# Patient Record
Sex: Female | Born: 1965
Health system: Southern US, Community
[De-identification: ages and names within clinical notes are randomized; demographics above are authoritative.]

## PROBLEM LIST (undated history)

## (undated) HISTORY — PX: TUBAL LIGATION: SHX77

## (undated) HISTORY — PX: COLONOSCOPY: SHX174

---

## 2000-08-15 ENCOUNTER — Inpatient Hospital Stay (HOSPITAL_COMMUNITY): Admission: AD | Admit: 2000-08-15 | Discharge: 2000-08-15 | Payer: Self-pay | Admitting: Obstetrics & Gynecology

## 2000-08-24 ENCOUNTER — Inpatient Hospital Stay (HOSPITAL_COMMUNITY): Admission: AD | Admit: 2000-08-24 | Discharge: 2000-08-24 | Payer: Self-pay | Admitting: Obstetrics

## 2000-09-30 ENCOUNTER — Ambulatory Visit (HOSPITAL_COMMUNITY): Admission: RE | Admit: 2000-09-30 | Discharge: 2000-09-30 | Payer: Self-pay | Admitting: *Deleted

## 2001-01-25 ENCOUNTER — Inpatient Hospital Stay (HOSPITAL_COMMUNITY): Admission: AD | Admit: 2001-01-25 | Discharge: 2001-01-25 | Payer: Self-pay | Admitting: *Deleted

## 2001-02-27 ENCOUNTER — Inpatient Hospital Stay (HOSPITAL_COMMUNITY): Admission: AD | Admit: 2001-02-27 | Discharge: 2001-03-02 | Payer: Self-pay | Admitting: *Deleted

## 2001-06-27 ENCOUNTER — Inpatient Hospital Stay (HOSPITAL_COMMUNITY): Admission: AD | Admit: 2001-06-27 | Discharge: 2001-06-27 | Payer: Self-pay | Admitting: Obstetrics & Gynecology

## 2003-01-05 ENCOUNTER — Ambulatory Visit (HOSPITAL_COMMUNITY): Admission: RE | Admit: 2003-01-05 | Discharge: 2003-01-05 | Payer: Self-pay | Admitting: *Deleted

## 2003-04-26 ENCOUNTER — Inpatient Hospital Stay (HOSPITAL_COMMUNITY): Admission: AD | Admit: 2003-04-26 | Discharge: 2003-04-28 | Payer: Self-pay | Admitting: *Deleted

## 2003-12-10 ENCOUNTER — Ambulatory Visit (HOSPITAL_COMMUNITY): Admission: RE | Admit: 2003-12-10 | Discharge: 2003-12-10 | Payer: Self-pay | Admitting: Obstetrics & Gynecology

## 2004-12-27 ENCOUNTER — Ambulatory Visit (HOSPITAL_COMMUNITY): Admission: RE | Admit: 2004-12-27 | Discharge: 2004-12-27 | Payer: Self-pay | Admitting: Obstetrics and Gynecology

## 2007-03-24 ENCOUNTER — Ambulatory Visit: Payer: Self-pay | Admitting: Gastroenterology

## 2007-03-25 ENCOUNTER — Ambulatory Visit (HOSPITAL_COMMUNITY): Admission: RE | Admit: 2007-03-25 | Discharge: 2007-03-25 | Payer: Self-pay | Admitting: Obstetrics & Gynecology

## 2007-03-28 ENCOUNTER — Encounter: Payer: Self-pay | Admitting: Gastroenterology

## 2007-03-28 ENCOUNTER — Ambulatory Visit: Payer: Self-pay | Admitting: Gastroenterology

## 2007-08-22 DIAGNOSIS — D126 Benign neoplasm of colon, unspecified: Secondary | ICD-10-CM

## 2007-08-22 DIAGNOSIS — R109 Unspecified abdominal pain: Secondary | ICD-10-CM

## 2007-08-22 DIAGNOSIS — K5900 Constipation, unspecified: Secondary | ICD-10-CM | POA: Insufficient documentation

## 2010-10-24 NOTE — Assessment & Plan Note (Signed)
[facility]                         GASTROENTEROLOGY OFFICE NOTE   NAME:Marie Bowers, Marie Bowers                             MRN:          [id number]  DATE:03/24/2007                            DOB:          10/01/1965    REFERRING PHYSICIAN:  Peyton Najjar, MD   REASON FOR CONSULTATION:  Lower abdominal pain and constipation.   HISTORY OF PRESENT ILLNESS:  Marie Bowers is a 45 year old [place] female  who does not speak [place]. She is here with relatives who translate for  her. She relates a history of lower abdominal pain and constipation that  was causing substantial difficulties approximately one month ago.  Referral notes from [facility] relate complaints of  left lower quadrant pain. Today, she relates problems with right lower  quadrant pain as well. She noted very hard, small stools associated with  intestinal gas. Recently, her stools have been more normal and she has  not had any pain or intestinal gas. There has been no weight loss,  melena or hematochezia. CBC from October 06, 2006 showed a hemoglobin of  13.9 with an MCV of 77.9, white blood cell count was normal at 6.8.  Additional blood work from Dr. Annamaria Helling office from March 21, 2007 showed a normal CBC with a hemoglobin of 15 and a normal MCV of  83.5. Comprehensive metabolic panel and TSH were normal. There is no  family history of colon cancer, colon polyps or inflammatory bowel  disease.   PAST MEDICAL HISTORY:  Negative.   PAST SURGICAL HISTORY:  Negative.   CURRENT MEDICATIONS:  None.   MEDICATION ALLERGIES:  None known.   SOCIAL HISTORY:  She is married with five children. She moved to the  [place] from [place] in 2001. She denies tobacco and alcohol  product usage.   REVIEW OF SYSTEMS:  Is entirely negative.   PHYSICAL EXAMINATION:  Well-developed, thin, Asian female in no acute  distress. Height 5 feet, 3 inches. Weight is 104 pounds. Blood  pressure  112/68, pulse 60 and regular.  HEENT: Anicteric sclerae. Oropharynx clear.  CHEST: Clear to auscultation bilaterally.  CARDIAC: Regular rate and rhythm without murmurs appreciated.  ABDOMEN: Soft, minimal right lower quadrant tenderness to deep  palpation. No rebound or guarding.  Normoactive bowel sounds. No  palpable organomegaly, masses or hernias.  RECTAL: Deferred to time of colonoscopy.  EXTREMITIES: Without clubbing, cyanosis or edema.  NEUROLOGIC: Alert and oriented x3. Grossly nonfocal.   ASSESSMENT/PLAN:  Change in bowel habits with constipation, right lower  quadrant pain and left lower quadrant pain. She is advised to  substantially increase her dietary fiber and water intake. May use Symax  SL two sublingual or orally q.i.d. p.r.n. for recurrent abdominal or  pelvic pain. She may use a fiber supplement or a mild laxative, such as  milk of magnesia or Miralax, as needed. Rule out colorectal neoplasms.  Risks, benefits and alternatives to colonoscopy with possible biopsy and  possible polypectomy discussed with the patient, via translation, and  she consents to proceed. This will be  scheduled electively.     Venita Lick. Russella Dar, MD, [facility]  Electronically Signed    MTS/MedQ  DD: 03/24/2007  DT: 03/24/2007  Job #: [id number]   cc:   Peyton Najjar, MD

## 2010-10-27 NOTE — Op Note (Signed)
NAME:  Marie Bowers, Marie Bowers                               ACCOUNT NO.:  [id number]   MEDICAL RECORD NO.:  [id number]                   PATIENT TYPE:  AMB   LOCATION:  SDC                                  FACILITY:  [facility]   PHYSICIAN:  Roseanna Rainbow, M.D.         DATE OF BIRTH:  1966/03/19   DATE OF PROCEDURE:  12/10/2003  DATE OF DISCHARGE:                                 OPERATIVE REPORT   PREOPERATIVE DIAGNOSIS:  Multiparity, desires permanent sterilization.   POSTOPERATIVE DIAGNOSIS:  Multiparity, desires permanent sterilization.   PROCEDURE:  Laparoscopic tubal ligation with bipolar cautery.   SURGEON:  Roseanna Rainbow, M.D.   ANESTHESIA:  General endotracheal.   COMPLICATIONS:  None.   ESTIMATED BLOOD LOSS:  Less than 25 mL.   FLUIDS REPLACED:  As per anesthesiology.   URINE OUTPUT:  100 mL clear urine at the beginning of the procedure.   FINDINGS:  Normal uterus, tubes, and ovaries.   TECHNIQUE:  The patient was taken to the operating room, where general  anesthesia was obtained without difficulty.  The patient was then examined  under anesthesia and found to have a small anteverted uterus and normal  adnexa.  She was then placed in the dorsal lithotomy position and prepped  and draped in the usual sterile fashion.  A bivalve speculum was then placed  in the patient's vagina and the anterior lip of the cervix was grasped with  a single-tooth tenaculum.  A Hulka uterine manipulator was then advanced to  the uterus and secured to the anterior lip of the cervix as a means to  manipulate the uterus.  The speculum was removed from the vagina.  Attention  was then turned to the patient's abdomen, where a 10 mm skin incision was  made in the umbilical fold.  This incision was carried down to the  underlying fascia.  The fascia was entered sharply.  The parietal peritoneum  was entered sharply.  The peritoneal incision was felt to be free of any  adhesions.  A Hasson trocar  and sleeve was then advanced without difficulty  into the abdomen.  The abdomen was then insufflated with CO2 gas.  The  operative scope was then introduced into the sleeve and a survey of the  patient's pelvis and abdomen revealed entirely normal anatomy.  The mid-  isthmic portion of the patient's left fallopian tube was then cauterized  contiguously.  With each application the Ohm meter was noted to go to 0.  The right fallopian tube was manipulated in a similar fashion.  The  instruments were removed from the patient's abdomen.  The fascial incision  was repaired with 0 Vicryl.  The skin was  reapproximated with interrupted subcuticular stitches of 3-0 Vicryl and  Dermabond.  The Hulka manipulator was then removed from the vagina with no  bleeding noted from the cervix.  The patient tolerated the procedure well.  Sponge, lap, and needle counts were correct x2.  The patient was taken to  the PACU in stable condition.                                               Roseanna Rainbow, M.D.    Judee Clara  D:  12/10/2003  T:  12/11/2003  Job:  [id number]

## 2012-01-31 ENCOUNTER — Encounter: Payer: Self-pay | Admitting: Gastroenterology

## 2012-10-01 ENCOUNTER — Encounter: Payer: Self-pay | Admitting: Gastroenterology

## 2013-03-02 ENCOUNTER — Ambulatory Visit: Payer: Self-pay | Admitting: Obstetrics & Gynecology

## 2013-03-20 ENCOUNTER — Ambulatory Visit: Payer: Self-pay | Admitting: Advanced Practice Midwife

## 2013-04-02 ENCOUNTER — Ambulatory Visit (INDEPENDENT_AMBULATORY_CARE_PROVIDER_SITE_OTHER): Payer: BC Managed Care – PPO | Admitting: Obstetrics & Gynecology

## 2013-04-02 ENCOUNTER — Encounter: Payer: Self-pay | Admitting: Obstetrics & Gynecology

## 2013-04-02 VITALS — BP 137/85 | HR 64 | Temp 97.9°F | Ht 63.0 in | Wt 124.0 lb

## 2013-04-02 DIAGNOSIS — N926 Irregular menstruation, unspecified: Secondary | ICD-10-CM

## 2013-04-02 LAB — COMPREHENSIVE METABOLIC PANEL
ALT: 11 U/L (ref 0–35)
AST: 17 U/L (ref 0–37)
CO2: 27 mEq/L (ref 19–32)
Creat: 0.72 mg/dL (ref 0.50–1.10)
Sodium: 139 mEq/L (ref 135–145)
Total Bilirubin: 0.3 mg/dL (ref 0.3–1.2)
Total Protein: 7.6 g/dL (ref 6.0–8.3)

## 2013-04-02 LAB — CBC
MCH: 25.5 pg — ABNORMAL LOW (ref 26.0–34.0)
MCV: 78.8 fL (ref 78.0–100.0)
Platelets: 302 10*3/uL (ref 150–400)
RBC: 5.09 MIL/uL (ref 3.87–5.11)
RDW: 13.7 % (ref 11.5–15.5)
WBC: 7.8 10*3/uL (ref 4.0–10.5)

## 2013-04-02 LAB — CHOLESTEROL, TOTAL: Cholesterol: 213 mg/dL — ABNORMAL HIGH (ref 0–200)

## 2013-04-02 NOTE — Progress Notes (Signed)
Subjective:     Marie Bowers is a 47 y.o. female here for a routine exam.  Current complaints: Patient is in the office today for irregular cycles- patient states she started irregular bleeding around July. Patient states she can have bleeding last up to 2- 3 weeks .  Personal health questionnaire reviewed: no.   Gynecologic History Patient's last menstrual period was 02/28/2013. Contraception: tubal ligation Last Pap:2011 . Results were: normal Last mammogram: over 1 year. Results were: normal  Obstetric History OB History  No data available     The following portions of the patient's history were reviewed and updated as appropriate: allergies, current medications, past family history, past medical history, past social history, past surgical history and problem list.  Review of Systems Pertinent items are noted in HPI.    Objective:      General appearance: alert Breasts: normal appearance, no masses or tenderness Abdomen: soft, non-tender; bowel sounds normal; no masses,  no organomegaly Pelvic: cervix normal in appearance, external genitalia normal, no adnexal masses or tenderness, uterus normal size, shape, and consistency and vagina normal without discharge       Assessment:   AUB--likely O, perimenopausal  Plan:   Orders Placed This Encounter  Procedures  . CBC  . Comprehensive metabolic panel  . Prolactin  . HDL cholesterol  . Cholesterol, total  . TSH  Return for sonoHSG w/endometrial bx

## 2013-04-03 ENCOUNTER — Encounter: Payer: Self-pay | Admitting: Obstetrics & Gynecology

## 2013-04-03 LAB — PAP IG W/ RFLX HPV ASCU

## 2013-04-03 LAB — TSH: TSH: 1.032 u[IU]/mL (ref 0.350–4.500)

## 2013-04-03 LAB — PROLACTIN: Prolactin: 12.7 ng/mL

## 2013-04-03 NOTE — Patient Instructions (Signed)

## 2013-04-07 ENCOUNTER — Other Ambulatory Visit: Payer: Self-pay | Admitting: *Deleted

## 2013-04-07 DIAGNOSIS — N926 Irregular menstruation, unspecified: Secondary | ICD-10-CM

## 2013-04-14 ENCOUNTER — Other Ambulatory Visit: Payer: Self-pay | Admitting: *Deleted

## 2013-04-14 ENCOUNTER — Telehealth: Payer: Self-pay | Admitting: *Deleted

## 2013-04-15 ENCOUNTER — Other Ambulatory Visit: Payer: BC Managed Care – PPO

## 2013-04-15 ENCOUNTER — Ambulatory Visit: Payer: BC Managed Care – PPO | Admitting: Obstetrics & Gynecology

## 2013-04-17 NOTE — Telephone Encounter (Signed)
error 

## 2013-05-13 ENCOUNTER — Other Ambulatory Visit: Payer: Self-pay

## 2013-05-13 ENCOUNTER — Ambulatory Visit (INDEPENDENT_AMBULATORY_CARE_PROVIDER_SITE_OTHER): Payer: BC Managed Care – PPO | Admitting: Obstetrics & Gynecology

## 2013-05-13 ENCOUNTER — Ambulatory Visit (INDEPENDENT_AMBULATORY_CARE_PROVIDER_SITE_OTHER): Payer: BC Managed Care – PPO

## 2013-05-13 ENCOUNTER — Encounter: Payer: Self-pay | Admitting: Obstetrics & Gynecology

## 2013-05-13 DIAGNOSIS — N926 Irregular menstruation, unspecified: Secondary | ICD-10-CM

## 2013-05-13 DIAGNOSIS — N939 Abnormal uterine and vaginal bleeding, unspecified: Secondary | ICD-10-CM | POA: Insufficient documentation

## 2013-05-13 NOTE — Progress Notes (Signed)
SONOHYSTEROGRAM PROCEDURE NOTE  SONOHYSTEROGRAM PROCEDURE:   A time-out was performed confirming patient name, allergy status and procedure.  A UPT was negative.  A Graves speculum was placed in the vagina.  The cervix was prepped with betadine.  The cervix was grasped with a single tooth tenaculum.  The IUI catherter was primed with sterile water.  The IUI was introduced into the uterine cavity.  The single tooth tenaculum and speculum were removed.  The vaginal probe was placed.  The sterile water was instilled in the uterine cavity.   An endometrial biopsy was taken prior to installation to the sterile water.  Scant tissue was retrieved after 2 passes.  The IUI was removed.  The single tooth tenaculum was removed with minimal bleeding noted from the cervix.  The patient tolerated the procedure well.  FINDINGS:  Thin, homogeneous endometrial lining.  No intracavitary lesions.

## 2013-05-13 NOTE — Patient Instructions (Signed)

## 2013-05-27 ENCOUNTER — Ambulatory Visit: Payer: BC Managed Care – PPO | Admitting: Obstetrics & Gynecology

## 2013-06-08 ENCOUNTER — Ambulatory Visit: Payer: BC Managed Care – PPO | Admitting: Obstetrics & Gynecology

## 2013-06-08 ENCOUNTER — Telehealth: Payer: Self-pay | Admitting: *Deleted

## 2013-06-08 NOTE — Telephone Encounter (Signed)
Patients daughter called and left voice message stating patient had a appointment to go over lab results. Patients daughter states patient has had to re-schedule appointment 3 times and she would like to know what the lab results are. After review of labs, patients daughter was informed per Dr Tamela Oddi instructions on elevation of cholesterol and suggested referral to primary care for evaluation. Patients daughter declined referral for primary care and stated patient will continue with exercise and diet change. She state if referral is needed she will call back to request.  Copy of lab results mailed to patient per request.

## 2013-07-01 ENCOUNTER — Encounter: Payer: Self-pay | Admitting: Obstetrics & Gynecology

## 2014-06-07 ENCOUNTER — Encounter: Payer: Self-pay | Admitting: *Deleted

## 2015-02-21 ENCOUNTER — Ambulatory Visit (INDEPENDENT_AMBULATORY_CARE_PROVIDER_SITE_OTHER): Payer: BLUE CROSS/BLUE SHIELD | Admitting: Family Medicine

## 2015-02-21 ENCOUNTER — Ambulatory Visit: Payer: BLUE CROSS/BLUE SHIELD

## 2015-02-21 ENCOUNTER — Other Ambulatory Visit: Payer: Self-pay | Admitting: Emergency Medicine

## 2015-02-21 VITALS — BP 106/68 | HR 60 | Temp 97.8°F | Resp 17 | Wt 121.0 lb

## 2015-02-21 DIAGNOSIS — R509 Fever, unspecified: Secondary | ICD-10-CM

## 2015-02-21 DIAGNOSIS — N951 Menopausal and female climacteric states: Secondary | ICD-10-CM | POA: Diagnosis not present

## 2015-02-21 DIAGNOSIS — Z789 Other specified health status: Secondary | ICD-10-CM

## 2015-02-21 DIAGNOSIS — R232 Flushing: Secondary | ICD-10-CM

## 2015-02-21 LAB — COMPREHENSIVE METABOLIC PANEL
ALT: 20 U/L (ref 6–29)
AST: 20 U/L (ref 10–35)
Albumin: 3.7 g/dL (ref 3.6–5.1)
Alkaline Phosphatase: 53 U/L (ref 33–115)
BUN: 14 mg/dL (ref 7–25)
CHLORIDE: 107 mmol/L (ref 98–110)
CO2: 27 mmol/L (ref 20–31)
CREATININE: 0.67 mg/dL (ref 0.50–1.10)
Calcium: 8.9 mg/dL (ref 8.6–10.2)
Glucose, Bld: 92 mg/dL (ref 65–99)
POTASSIUM: 4.2 mmol/L (ref 3.5–5.3)
Sodium: 139 mmol/L (ref 135–146)
Total Bilirubin: 0.5 mg/dL (ref 0.2–1.2)
Total Protein: 6.8 g/dL (ref 6.1–8.1)

## 2015-02-21 LAB — POCT CBC
GRANULOCYTE PERCENT: 50.9 % (ref 37–80)
HEMATOCRIT: 42.1 % (ref 37.7–47.9)
HEMOGLOBIN: 13.2 g/dL (ref 12.2–16.2)
Lymph, poc: 2.3 (ref 0.6–3.4)
MCH: 24.7 pg — AB (ref 27–31.2)
MCHC: 31.5 g/dL — AB (ref 31.8–35.4)
MCV: 78.5 fL — AB (ref 80–97)
MID (cbc): 0.4 (ref 0–0.9)
MPV: 8.3 fL (ref 0–99.8)
POC GRANULOCYTE: 2.8 (ref 2–6.9)
POC LYMPH PERCENT: 41 %L (ref 10–50)
POC MID %: 8.1 % (ref 0–12)
Platelet Count, POC: 233 10*3/uL (ref 142–424)
RBC: 5.35 M/uL (ref 4.04–5.48)
RDW, POC: 14.8 %
WBC: 5.5 10*3/uL (ref 4.6–10.2)

## 2015-02-21 LAB — TSH: TSH: 0.489 u[IU]/mL (ref 0.350–4.500)

## 2015-02-21 LAB — POCT URINE PREGNANCY: PREG TEST UR: NEGATIVE

## 2015-02-21 NOTE — Patient Instructions (Signed)
Please come and see [place] for your chest x-ray when you can- this is an important test to do  I will be in touch with your labs

## 2015-02-21 NOTE — Progress Notes (Signed)
[facility] [address] [place] [address] [phone]  Date:  02/21/2015   Name:  Marie Bowers   DOB:  December 31, 1965   MRN:  [id number]  PCP:  No primary care provider on file.    Chief Complaint: Fever; Cough; and Acne   History of Present Illness:  Marie Bowers is a 49 y.o. very pleasant female patient who presents with the following:  Here today as a new patient.   History of BTL and she is also post- menopausal  She has been having fevers at night and in the morning for since June.    She has been having sweats- they have not actually measured a temperature.  She has an occasional daytime hot flash.   Pt dos not speak [place]- however a family member translates for her  She works once a week at a [employer]; this seems to maybe make her worse  Her LMP was in December of 2015. She has notes more acne over the last few months.  She has lost about 5 lbs by her recollection   Patient Active Problem List   Diagnosis Date Noted  . Abnormal uterine bleeding 05/13/2013  . COLONIC POLYPS, ADENOMATOUS 08/22/2007  . CONSTIPATION 08/22/2007  . ABDOMINAL PAIN, LOWER 08/22/2007    No past medical history on file.  Past Surgical History  Procedure Laterality Date  . Tubal ligation      Social History  Substance Use Topics  . Smoking status: Never Smoker   . Smokeless tobacco: None  . Alcohol Use: No    Family History  Problem Relation Age of Onset  . Adopted: Yes    No Known Allergies  Medication list has been reviewed and updated.  Current Outpatient Prescriptions on File Prior to Visit  Medication Sig Dispense Refill  . Multiple Vitamins-Minerals (MULTIVITAMIN WITH MINERALS) tablet Take 1 tablet by mouth daily.     No current facility-administered medications on file prior to visit.    Review of Systems:  As per HPI- otherwise negative.   Physical Examination: Filed Vitals:   02/21/15 1128  BP: 106/68  Pulse: 60  Temp: 97.8 F (36.6  C)  Resp: 17   Filed Vitals:   02/21/15 1128  Weight: 121 lb (54.885 kg)   Body mass index is 21.44 kg/(m^2). Ideal Body Weight:    GEN: WDWN, NAD, Non-toxic, A & O x 3 HEENT: Atraumatic, Normocephalic. Neck supple. No masses, No LAD.  Bilateral TM wnl, oropharynx normal.  PEERL,EOMI.   Ears and Nose: No external deformity. CV: RRR, No M/G/R. No JVD. No thrill. No extra heart sounds. PULM: CTA B, no wheezes, crackles, rhonchi. No retractions. No resp. distress. No accessory muscle use. ABD: S, NT, ND, No rebound. No HSM. EXTR: No c/c/e NEURO Normal gait.  PSYCH: Normally interactive. Conversant. Not depressed or anxious appearing.  Calm demeanor.   Results for orders placed or performed in visit on 02/21/15  POCT urine pregnancy  Result Value Ref Range   Preg Test, Ur Negative Negative    Assessment and Plan: Fever, unspecified fever cause - Plan: POCT CBC, Comprehensive metabolic panel, TSH, POCT urine pregnancy, HIV antibody, Acute Hep Panel & Hep B Surface Ab, CANCELED: DG Chest 2 View  Hot flashes - Plan: HIV antibody  Pt decided she could not wait for a chest x-ray Labs pending as above There is a cultural and language barrier which complicates this issue, but I suspect her fevers may actually  be hot flashes associated with menopause Encouraged them to check her temp and see if she is running a fever Instructed to come back for CXR asap  Signed Lamar Blinks, MD

## 2015-02-22 LAB — ACUTE HEP PANEL AND HEP B SURFACE AB
HCV AB: NEGATIVE
HEP B C IGM: NONREACTIVE
HEP B S AB: POSITIVE — AB
Hep A IgM: NONREACTIVE
Hepatitis B Surface Ag: NEGATIVE

## 2015-02-22 LAB — HIV ANTIBODY (ROUTINE TESTING W REFLEX): HIV 1&2 Ab, 4th Generation: NONREACTIVE

## 2015-02-23 ENCOUNTER — Encounter: Payer: Self-pay | Admitting: Family Medicine

## 2015-02-28 ENCOUNTER — Ambulatory Visit (INDEPENDENT_AMBULATORY_CARE_PROVIDER_SITE_OTHER): Payer: BLUE CROSS/BLUE SHIELD

## 2015-02-28 ENCOUNTER — Other Ambulatory Visit: Payer: Self-pay | Admitting: *Deleted

## 2015-02-28 ENCOUNTER — Ambulatory Visit (INDEPENDENT_AMBULATORY_CARE_PROVIDER_SITE_OTHER): Payer: BLUE CROSS/BLUE SHIELD | Admitting: Family Medicine

## 2015-02-28 VITALS — BP 116/76 | HR 65 | Temp 98.0°F | Resp 18 | Ht 63.0 in | Wt 120.0 lb

## 2015-02-28 DIAGNOSIS — N951 Menopausal and female climacteric states: Secondary | ICD-10-CM

## 2015-02-28 DIAGNOSIS — R232 Flushing: Secondary | ICD-10-CM

## 2015-02-28 MED ORDER — PAROXETINE HCL 10 MG PO TABS: 10.0000 mg | ORAL_TABLET | Freq: Every day | ORAL | Status: AC

## 2015-02-28 NOTE — Progress Notes (Signed)
[facility] [address], [place] [address] [phone]  Date:  02/28/2015   Name:  Marie Bowers   DOB:  1966-05-08   MRN:  [id number]  PCP:  No primary care provider on file.    Chief Complaint: Follow-up   History of Present Illness:  Marie Bowers is a 49 y.o. very pleasant female patient who presents with the following:  I saw this pt a week ago with complaint of "fevers," which we think may have actually been hot flashes.   At that time we planned labs and a CXR, but she left without getting her x-ray Other labs so far have looked ok She came in today to get herx-ray Has has sweats just a couple of times since her last visit Still has not checked her temperature so we do not know if she is having fevers  Patient Active Problem List   Diagnosis Date Noted  . Language barrier affecting health care 02/21/2015  . Abnormal uterine bleeding 05/13/2013  . COLONIC POLYPS, ADENOMATOUS 08/22/2007  . CONSTIPATION 08/22/2007  . ABDOMINAL PAIN, LOWER 08/22/2007    History reviewed. No pertinent past medical history.  Past Surgical History  Procedure Laterality Date  . Tubal ligation      Social History  Substance Use Topics  . Smoking status: Never Smoker   . Smokeless tobacco: None  . Alcohol Use: No    Family History  Problem Relation Age of Onset  . Adopted: Yes    No Known Allergies  Medication list has been reviewed and updated.  Current Outpatient Prescriptions on File Prior to Visit  Medication Sig Dispense Refill  . Multiple Vitamins-Minerals (MULTIVITAMIN WITH MINERALS) tablet Take 1 tablet by mouth daily.     No current facility-administered medications on file prior to visit.    Review of Systems:  As per HPI- otherwise negative.   Physical Examination: Filed Vitals:   02/28/15 1122  BP: 116/76  Pulse: 65  Temp: 98 F (36.7 C)  Resp: 18   Filed Vitals:   02/28/15 1122  Height: 5\' 3"  (1.6 m)  Weight: 120 lb (54.432 kg)    Body mass index is 21.26 kg/(m^2). Ideal Body Weight: Weight in (lb) to have BMI = 25: 140.8  GEN: WDWN, NAD, Non-toxic, A & O x 3, looks well, normal weight HEENT: Atraumatic, Normocephalic. Neck supple. No masses, No LAD. Ears and Nose: No external deformity. CV: RRR, No M/G/R. No JVD. No thrill. No extra heart sounds. PULM: CTA B, no wheezes, crackles, rhonchi. No retractions. No resp. distress. No accessory muscle use. EXTR: No c/c/e NEURO Normal gait.  PSYCH: Normally interactive. Conversant. Not depressed or anxious appearing.  Calm demeanor.   [facility] reading (PRIMARY) by  Dr. Lorelei Pont. CXR: no evidence of TB   COMPARISON: None.  FINDINGS: The heart size and mediastinal contours are within normal limits. Both lungs are clear. The visualized skeletal structures are unremarkable.  IMPRESSION: No active cardiopulmonary disease.    Assessment and Plan: Hot flashes - Plan: DG Chest 2 View, PARoxetine (PAXIL) 10 MG tablet  Suspect that her fevers are hot flashes.  However she has not been checking her temperature.  Asked her to please do this so we can find out if she is really running any fever.  Will start on low dose paxil at 10 mg a day for now for suspected vasomotor sx  Signed Lamar Blinks, MD

## 2015-02-28 NOTE — Patient Instructions (Signed)
I suspect that you are having hot flashes (these are common in women who are going through menopause) It is less likely that you are actually having fevers; however, please check your temperature with a thermometer when you have these symtpoms If you are not sure if your temperatures indicate a fever please let me know In the meantime we will start paxil 10 mg; take this once a day I hope that in a few weeks it will help with your hot flashes Let me know if anything is changing or getting worse   Please see [place] in 2-71months for a recheck

## 2019-11-30 ENCOUNTER — Encounter: Payer: Self-pay | Admitting: Obstetrics and Gynecology

## 2019-11-30 ENCOUNTER — Ambulatory Visit (INDEPENDENT_AMBULATORY_CARE_PROVIDER_SITE_OTHER): Payer: BC Managed Care – PPO | Admitting: Obstetrics and Gynecology

## 2019-11-30 ENCOUNTER — Other Ambulatory Visit (HOSPITAL_COMMUNITY)
Admission: RE | Admit: 2019-11-30 | Discharge: 2019-11-30 | Disposition: A | Discharge status: Home / Self Care | Payer: BC Managed Care – PPO | Source: Ambulatory Visit | Attending: Obstetrics and Gynecology | Admitting: Obstetrics and Gynecology

## 2019-11-30 VITALS — BP 124/81 | HR 60 | Ht 63.0 in | Wt 125.0 lb

## 2019-11-30 DIAGNOSIS — Z01419 Encounter for gynecological examination (general) (routine) without abnormal findings: Secondary | ICD-10-CM | POA: Diagnosis not present

## 2019-11-30 DIAGNOSIS — Z13220 Encounter for screening for lipoid disorders: Secondary | ICD-10-CM | POA: Diagnosis not present

## 2019-11-30 DIAGNOSIS — Z1329 Encounter for screening for other suspected endocrine disorder: Secondary | ICD-10-CM | POA: Diagnosis not present

## 2019-11-30 DIAGNOSIS — Z131 Encounter for screening for diabetes mellitus: Secondary | ICD-10-CM | POA: Diagnosis not present

## 2019-11-30 NOTE — Progress Notes (Signed)
Pt presents for annual and pap. STD testing offered; pt declined  Pt has no complaints Overdue Mammogram Colonoscopy UTD per pt  PHQ= 0

## 2019-11-30 NOTE — Progress Notes (Signed)
Subjective:     Marie Bowers is a 54 y.o. female and is here for a comprehensive physical exam. The patient reports no problems. She reports onset of menopause 4 years ago without any episodes of postmenopausal vaginal bleeding. She is sexually active without complaints. She denies pelvic pain or abnormal discharge. She denies any issues with urinary continence.   History reviewed. No pertinent past medical history. Past Surgical History:  Procedure Laterality Date   TUBAL LIGATION     Family History  Adopted: Yes    Social History   Socioeconomic History   Marital status: Married    Spouse name: Not on file   Number of children: Not on file   Years of education: Not on file   Highest education level: Not on file  Occupational History   Not on file  Tobacco Use   Smoking status: Never Smoker  Substance and Sexual Activity   Alcohol use: No   Drug use: No   Sexual activity: Yes    Partners: Male    Birth control/protection: Surgical  Other Topics Concern   Not on file  Social History Narrative   Not on file   Social Determinants of Health   Financial Resource Strain:    Difficulty of Paying Living Expenses:   Food Insecurity:    Worried About Charity fundraiser in the Last Year:    Arboriculturist in the Last Year:   Transportation Needs:    Film/video editor (Medical):    Lack of Transportation (Non-Medical):   Physical Activity:    Days of Exercise per Week:    Minutes of Exercise per Session:   Stress:    Feeling of Stress :   Social Connections:    Frequency of Communication with Friends and Family:    Frequency of Social Gatherings with Friends and Family:    Attends Religious Services:    Active Member of Clubs or Organizations:    Attends Music therapist:    Marital Status:   Intimate Partner Violence:    Fear of Current or Ex-Partner:    Emotionally Abused:    Physically Abused:    Sexually Abused:     Health Maintenance  Topic Date Due   COVID-19 Vaccine (1) Never done   TETANUS/TDAP  Never done   MAMMOGRAM  Never done   PAP SMEAR-Modifier  04/02/2016   COLONOSCOPY  03/27/2017   INFLUENZA VACCINE  01/10/2020   Hepatitis C Screening  Completed   HIV Screening  Completed       Review of Systems Pertinent items noted in HPI and remainder of comprehensive ROS otherwise negative.   Objective:  Blood pressure 124/81, pulse 60, height 5\' 3"  (1.6 m), weight 125 lb (56.7 kg), last menstrual period 02/28/2013.     GENERAL: Well-developed, well-nourished female in no acute distress.  HEENT: Normocephalic, atraumatic. Sclerae anicteric.  NECK: Supple. Normal thyroid.  LUNGS: Clear to auscultation bilaterally.  HEART: Regular rate and rhythm. BREASTS: Symmetric in size. No palpable masses or lymphadenopathy, skin changes, or nipple drainage. ABDOMEN: Soft, nontender, nondistended. No organomegaly. PELVIC: Normal external female genitalia. Vagina is pink and rugated.  Normal discharge. Normal appearing cervix. Uterus is normal in size. No adnexal mass or tenderness. EXTREMITIES: No cyanosis, clubbing, or edema, 2+ distal pulses.    Assessment:    Healthy female exam.      Plan:    Pap smear collected Screening mammogram ordered Fasting labs ordered  Patient  will be contacted with abnormal results See After Visit Summary for Counseling Recommendations

## 2019-12-01 LAB — LIPID PANEL
Chol/HDL Ratio: 3.9 ratio (ref 0.0–4.4)
Cholesterol, Total: 219 mg/dL — ABNORMAL HIGH (ref 100–199)
HDL: 56 mg/dL (ref >39)
LDL Chol Calc (NIH): 142 mg/dL — ABNORMAL HIGH (ref 0–99)
Triglycerides: 116 mg/dL (ref 0–149)
VLDL Cholesterol Cal: 21 mg/dL (ref 5–40)

## 2019-12-01 LAB — COMPREHENSIVE METABOLIC PANEL
ALT: 12 IU/L (ref 0–32)
AST: 19 IU/L (ref 0–40)
Albumin/Globulin Ratio: 1.5 (ref 1.2–2.2)
Albumin: 4.1 g/dL (ref 3.8–4.9)
Alkaline Phosphatase: 60 IU/L (ref 48–121)
BUN/Creatinine Ratio: 26 — ABNORMAL HIGH (ref 9–23)
BUN: 17 mg/dL (ref 6–24)
Bilirubin Total: 0.3 mg/dL (ref 0.0–1.2)
CO2: 22 mmol/L (ref 20–29)
Calcium: 9 mg/dL (ref 8.7–10.2)
Chloride: 109 mmol/L — ABNORMAL HIGH (ref 96–106)
Creatinine, Ser: 0.66 mg/dL (ref 0.57–1.00)
GFR calc Af Amer: 116 mL/min/{1.73_m2} (ref >59)
GFR calc non Af Amer: 100 mL/min/{1.73_m2} (ref >59)
Globulin, Total: 2.8 g/dL (ref 1.5–4.5)
Glucose: 100 mg/dL — ABNORMAL HIGH (ref 65–99)
Potassium: 4.5 mmol/L (ref 3.5–5.2)
Sodium: 142 mmol/L (ref 134–144)
Total Protein: 6.9 g/dL (ref 6.0–8.5)

## 2019-12-01 LAB — CBC
Hematocrit: 43.8 % (ref 34.0–46.6)
Hemoglobin: 13.7 g/dL (ref 11.1–15.9)
MCH: 25.7 pg — ABNORMAL LOW (ref 26.6–33.0)
MCHC: 31.3 g/dL — ABNORMAL LOW (ref 31.5–35.7)
MCV: 82 fL (ref 79–97)
Platelets: 248 10*3/uL (ref 150–450)
RBC: 5.33 x10E6/uL — ABNORMAL HIGH (ref 3.77–5.28)
RDW: 13.4 % (ref 11.7–15.4)
WBC: 6.1 10*3/uL (ref 3.4–10.8)

## 2019-12-01 LAB — TSH: TSH: 1.41 u[IU]/mL (ref 0.450–4.500)

## 2019-12-01 LAB — HEMOGLOBIN A1C
Est. average glucose Bld gHb Est-mCnc: 114 mg/dL
Hgb A1c MFr Bld: 5.6 % (ref 4.8–5.6)

## 2019-12-03 LAB — CYTOLOGY - PAP
Comment: NEGATIVE
Diagnosis: NEGATIVE
High risk HPV: NEGATIVE

## 2019-12-28 ENCOUNTER — Ambulatory Visit
Admission: RE | Admit: 2019-12-28 | Discharge: 2019-12-28 | Disposition: A | Discharge status: Home / Self Care | Payer: BC Managed Care – PPO | Source: Ambulatory Visit | Attending: Obstetrics and Gynecology | Admitting: Obstetrics and Gynecology

## 2019-12-28 ENCOUNTER — Other Ambulatory Visit: Payer: Self-pay

## 2019-12-28 DIAGNOSIS — Z1231 Encounter for screening mammogram for malignant neoplasm of breast: Secondary | ICD-10-CM | POA: Diagnosis not present

## 2019-12-28 DIAGNOSIS — Z01419 Encounter for gynecological examination (general) (routine) without abnormal findings: Secondary | ICD-10-CM

## 2019-12-30 ENCOUNTER — Other Ambulatory Visit: Payer: Self-pay | Admitting: Obstetrics and Gynecology

## 2019-12-30 DIAGNOSIS — R928 Other abnormal and inconclusive findings on diagnostic imaging of breast: Secondary | ICD-10-CM

## 2020-01-11 ENCOUNTER — Ambulatory Visit
Admission: RE | Admit: 2020-01-11 | Discharge: 2020-01-11 | Disposition: A | Discharge status: Home / Self Care | Payer: BC Managed Care – PPO | Source: Ambulatory Visit | Attending: Obstetrics and Gynecology | Admitting: Obstetrics and Gynecology

## 2020-01-11 ENCOUNTER — Other Ambulatory Visit: Payer: Self-pay

## 2020-01-11 DIAGNOSIS — N6489 Other specified disorders of breast: Secondary | ICD-10-CM | POA: Diagnosis not present

## 2020-01-11 DIAGNOSIS — R922 Inconclusive mammogram: Secondary | ICD-10-CM | POA: Diagnosis not present

## 2020-01-11 DIAGNOSIS — R928 Other abnormal and inconclusive findings on diagnostic imaging of breast: Secondary | ICD-10-CM

## 2020-09-01 DIAGNOSIS — S92902A Unspecified fracture of left foot, initial encounter for closed fracture: Secondary | ICD-10-CM

## 2020-09-01 HISTORY — DX: Unspecified fracture of left foot, initial encounter for closed fracture: S92.902A

## 2020-10-02 DIAGNOSIS — S92255A Nondisplaced fracture of navicular [scaphoid] of left foot, initial encounter for closed fracture: Secondary | ICD-10-CM | POA: Diagnosis not present

## 2020-10-02 DIAGNOSIS — S82402A Unspecified fracture of shaft of left fibula, initial encounter for closed fracture: Secondary | ICD-10-CM | POA: Diagnosis not present

## 2020-10-24 ENCOUNTER — Other Ambulatory Visit: Payer: Self-pay

## 2020-10-24 ENCOUNTER — Encounter: Payer: Self-pay | Admitting: Obstetrics

## 2020-10-24 ENCOUNTER — Ambulatory Visit: Payer: BC Managed Care – PPO | Admitting: Obstetrics

## 2020-10-24 VITALS — BP 124/84 | HR 67 | Ht 63.0 in | Wt 120.0 lb

## 2020-10-24 NOTE — Progress Notes (Signed)
Patient presents for Annual Exam today.  LMP: PM Last pap: 11/30/19 WNL *has not been 1 yr since last annual. Pt made aware unsure if ins will cover pap Contraception:BTL STD Screening: None Family Hx of Breast Cancer: None  CC: None

## 2020-12-03 DIAGNOSIS — M79672 Pain in left foot: Secondary | ICD-10-CM | POA: Diagnosis not present

## 2020-12-19 ENCOUNTER — Ambulatory Visit (INDEPENDENT_AMBULATORY_CARE_PROVIDER_SITE_OTHER): Payer: BC Managed Care – PPO | Admitting: Obstetrics and Gynecology

## 2020-12-19 ENCOUNTER — Other Ambulatory Visit (HOSPITAL_COMMUNITY)
Admission: RE | Admit: 2020-12-19 | Discharge: 2020-12-19 | Disposition: A | Discharge status: Home / Self Care | Payer: BC Managed Care – PPO | Source: Ambulatory Visit | Attending: Obstetrics and Gynecology | Admitting: Obstetrics and Gynecology

## 2020-12-19 ENCOUNTER — Encounter: Payer: Self-pay | Admitting: Obstetrics and Gynecology

## 2020-12-19 ENCOUNTER — Other Ambulatory Visit: Payer: Self-pay

## 2020-12-19 VITALS — BP 114/71 | HR 56 | Ht 63.0 in | Wt 127.8 lb

## 2020-12-19 DIAGNOSIS — Z01419 Encounter for gynecological examination (general) (routine) without abnormal findings: Secondary | ICD-10-CM | POA: Diagnosis not present

## 2020-12-19 NOTE — Progress Notes (Signed)
Annual exam No complaints Family medical history reviewed. All negative. No complaints today. Uncertain about getting yearly mammogram.

## 2020-12-19 NOTE — Progress Notes (Signed)
Subjective:     Marie Bowers is a 55 y.o. female P6 postmenopausal with BMI 22 who is here for a comprehensive physical exam. The patient reports no problems. She is sexually active occasionally and denies dyspareunia. She denies any urinary incontinence. She denies any episodes of vaginal bleeding. She denies pelvic pain or abnormal discharge  Past Medical History:  Diagnosis Date   Foot fracture, left 09/01/2020   Past Surgical History:  Procedure Laterality Date   TUBAL LIGATION     Family History  Adopted: Yes    Social History   Socioeconomic History   Marital status: Married    Spouse name: Not on file   Number of children: Not on file   Years of education: Not on file   Highest education level: Not on file  Occupational History   Not on file  Tobacco Use   Smoking status: Never   Smokeless tobacco: Never  Vaping Use   Vaping Use: Never used  Substance and Sexual Activity   Alcohol use: No   Drug use: No   Sexual activity: Yes    Partners: Male    Birth control/protection: Surgical  Other Topics Concern   Not on file  Social History Narrative   Not on file   Social Determinants of Health   Financial Resource Strain: Not on file  Food Insecurity: Not on file  Transportation Needs: Not on file  Physical Activity: Not on file  Stress: Not on file  Social Connections: Not on file  Intimate Partner Violence: Not on file   Health Maintenance  Topic Date Due   TETANUS/TDAP  Never done   Zoster Vaccines- Shingrix (1 of 2) Never done   COLONOSCOPY (Pts 45-103yrs Insurance coverage will need to be confirmed)  03/27/2017   INFLUENZA VACCINE  01/09/2021   MAMMOGRAM  12/27/2021   PAP SMEAR-Modifier  11/30/2022   Hepatitis C Screening  Completed   HIV Screening  Completed   Pneumococcal Vaccine 17-19 Years old  Aged Out   HPV VACCINES  Aged Out       Review of Systems Pertinent items noted in HPI and remainder of comprehensive ROS otherwise negative.    Objective:  Blood pressure 114/71, pulse (!) 56, height 5\' 3"  (1.6 m), weight 127 lb 12.8 oz (58 kg), last menstrual period 02/28/2013.    GENERAL: Well-developed, well-nourished female in no acute distress.  HEENT: Normocephalic, atraumatic. Sclerae anicteric.  NECK: Supple. Normal thyroid.  LUNGS: Clear to auscultation bilaterally.  HEART: Regular rate and rhythm. BREASTS: Symmetric in size. No palpable masses or lymphadenopathy, skin changes, or nipple drainage. ABDOMEN: Soft, nontender, nondistended. No organomegaly. PELVIC: Normal external female genitalia. Vagina is pale and atrophic.  Normal discharge. Normal appearing cervix. Uterus is normal in size.  No adnexal mass or tenderness. EXTREMITIES: No cyanosis, clubbing, or edema, 2+ distal pulses.    Assessment:    Healthy female exam.      Plan:    Pap smear collected Screening mammogram ordered Patient reports normal colonoscopy 5 years ago Patient will be contacted with abnormal results See After Visit Summary for Counseling Recommendations

## 2020-12-26 LAB — CYTOLOGY - PAP: Diagnosis: NEGATIVE

## 2020-12-29 ENCOUNTER — Other Ambulatory Visit: Payer: Self-pay

## 2020-12-29 ENCOUNTER — Ambulatory Visit (HOSPITAL_BASED_OUTPATIENT_CLINIC_OR_DEPARTMENT_OTHER)
Admission: RE | Admit: 2020-12-29 | Discharge: 2020-12-29 | Disposition: A | Discharge status: Home / Self Care | Payer: BC Managed Care – PPO | Source: Ambulatory Visit | Attending: Obstetrics and Gynecology | Admitting: Obstetrics and Gynecology

## 2020-12-29 DIAGNOSIS — Z01419 Encounter for gynecological examination (general) (routine) without abnormal findings: Secondary | ICD-10-CM

## 2020-12-29 DIAGNOSIS — Z1231 Encounter for screening mammogram for malignant neoplasm of breast: Secondary | ICD-10-CM | POA: Insufficient documentation

## 2020-12-29 DIAGNOSIS — Z0131 Encounter for examination of blood pressure with abnormal findings: Secondary | ICD-10-CM | POA: Diagnosis not present

## 2022-03-27 ENCOUNTER — Encounter: Payer: Self-pay | Admitting: Student

## 2022-03-27 ENCOUNTER — Ambulatory Visit (INDEPENDENT_AMBULATORY_CARE_PROVIDER_SITE_OTHER): Payer: BC Managed Care – PPO | Admitting: Student

## 2022-03-27 VITALS — BP 130/74 | HR 63 | Ht 63.0 in | Wt 124.6 lb

## 2022-03-27 DIAGNOSIS — Z789 Other specified health status: Secondary | ICD-10-CM

## 2022-03-27 DIAGNOSIS — Z01419 Encounter for gynecological examination (general) (routine) without abnormal findings: Secondary | ICD-10-CM | POA: Diagnosis not present

## 2022-03-27 MED ORDER — CENTRUM PO CHEW: 1.0000 | CHEWABLE_TABLET | Freq: Every day | ORAL | 6 refills | Status: AC

## 2022-03-27 NOTE — Progress Notes (Signed)
ANNUAL EXAM Patient nameMaliha Bowers MRN [id number]  Date of birth: [birth year] Chief Complaint:   Gynecologic Exam  History of Present Illness:   Marie Bowers is a 56 y.o. G18P0015  Asian  female being seen today for a routine annual exam.  Current complaints: None. Denies any abnormal vaginal discharge, odor, pain. Denies any breast tenderness, redness, discharge, or lumps. No questions or concerns at this time. Would like to start taking a Multivitamin.   Patient's last menstrual period was 02/28/2013.  Post-menopausal and s/p bilateral tubal ligation.   Last pap 2022. Results were: NILM w/ HRHPV not done. H/O abnormal pap: no Last mammogram: 2022. Results were: normal. Family h/o breast cancer: no Last colonoscopy: 2008. Results were: normal. Family h/o colorectal cancer: no     03/27/2022    8:36 AM 11/30/2019    9:10 AM 02/28/2015   11:22 AM 02/21/2015   11:30 AM  Depression screen PHQ 2/9  Decreased Interest 0 0 0 0  Down, Depressed, Hopeless 0 0 0 0  PHQ - 2 Score 0 0 0 0  Altered sleeping 0 0    Tired, decreased energy 0 0    Change in appetite 0 0    Feeling bad or failure about yourself  0 0    Trouble concentrating 0 0    Moving slowly or fidgety/restless 0 0    Suicidal thoughts 0 0    PHQ-9 Score 0 0    Difficult doing work/chores  Not difficult at all          03/27/2022    8:36 AM  GAD 7 : Generalized Anxiety Score  Nervous, Anxious, on Edge 0  Control/stop worrying 0  Worry too much - different things 0  Trouble relaxing 0  Restless 0  Easily annoyed or irritable 0  Afraid - awful might happen 0  Total GAD 7 Score 0     Review of Systems:   Pertinent items are noted in HPI Denies any headaches, blurred vision, fatigue, shortness of breath, chest pain, abdominal pain, abnormal vaginal discharge/itching/odor/irritation, problems with periods, bowel movements, urination, or intercourse unless otherwise stated above. Pertinent History Reviewed:  Reviewed  past medical,surgical, social and family history.  Reviewed problem list, medications and allergies. Physical Assessment:   Vitals:   03/27/22 0830  BP: 130/74  Pulse: 63  Weight: 124 lb 9.6 oz (56.5 kg)  Height: '5\' 3"'$  (1.6 m)  Body mass index is 22.07 kg/m.        Physical Examination:   General appearance - well appearing, and in no distress  Mental status - alert, oriented to person, place, and time  Psych:  She has a normal mood and affect  Skin - warm and dry, normal color, no suspicious lesions noted  Chest - effort normal, all lung fields clear to auscultation bilaterally  Heart - normal rate and regular rhythm  Neck:  midline trachea, no thyromegaly or nodules  Breasts - breasts appear normal, no suspicious masses, no skin or nipple changes or axillary nodes  Abdomen - soft, nontender, nondistended, no masses or organomegaly  Pelvic - deferred  Thin prep pap is not done   Extremities:  No swelling or varicosities noted  Chaperone present for exam  No results found for this or any previous visit (from the past 24 hour(s)).  Assessment & Plan:  1. Women's annual routine gynecological examination - Normal exam, reviewed with patient. Questions addressed.  - Patient declined PAP today. Discussed  the purpose of this screening measure and strongly encouraged patient to have a follow-up exam in 1 year. Recommend HPV testing.  - Ambulatory referral to Gastroenterology - MM 3D SCREEN BREAST BILATERAL; Future - multivitamin-iron-minerals-folic acid (CENTRUM) chewable tablet; Chew 1 tablet by mouth daily.  Dispense: 60 tablet; Refill: 6  2. Language barrier affecting health care - Interpreter present and at bedside throughout visit   Labs/procedures today: None  Mammogram: schedule screening mammo as soon as possible, or sooner if problems Colonoscopy: schedule screening colonoscopy as soon as possible, or sooner if problems  Orders Placed This Encounter  Procedures   MM  3D SCREEN BREAST BILATERAL   Ambulatory referral to Gastroenterology    Meds:  Meds ordered this encounter  Medications   multivitamin-iron-minerals-folic acid (CENTRUM) chewable tablet    Sig: Chew 1 tablet by mouth daily.    Dispense:  60 tablet    Refill:  6    Order Specific Question:   Supervising Provider    Answer:   Donnamae Jude [[id number]]    Follow-up: Return in about 1 year (around 03/28/2023), or if symptoms worsen or fail to improve, for ANN.  Johnston Ebbs, NP 03/27/2022 9:03 AM

## 2022-03-27 NOTE — Progress Notes (Signed)
Patient presents for annual exam. Denies any abnormal vaginal discharge, odor, pain. Denies any breast tenderness, redness, discharge, or lumps. No questions or concerns at this time.

## 2022-04-03 ENCOUNTER — Ambulatory Visit (HOSPITAL_BASED_OUTPATIENT_CLINIC_OR_DEPARTMENT_OTHER)
Admission: RE | Admit: 2022-04-03 | Discharge: 2022-04-03 | Disposition: A | Discharge status: Home / Self Care | Payer: BC Managed Care – PPO | Source: Ambulatory Visit | Attending: Student | Admitting: Student

## 2022-04-03 DIAGNOSIS — Z1231 Encounter for screening mammogram for malignant neoplasm of breast: Secondary | ICD-10-CM | POA: Insufficient documentation

## 2022-04-03 DIAGNOSIS — Z01419 Encounter for gynecological examination (general) (routine) without abnormal findings: Secondary | ICD-10-CM | POA: Diagnosis not present

## 2022-05-06 DIAGNOSIS — J029 Acute pharyngitis, unspecified: Secondary | ICD-10-CM | POA: Diagnosis not present

## 2022-05-06 DIAGNOSIS — R509 Fever, unspecified: Secondary | ICD-10-CM | POA: Diagnosis not present

## 2022-05-06 DIAGNOSIS — R051 Acute cough: Secondary | ICD-10-CM | POA: Diagnosis not present

## 2022-05-06 DIAGNOSIS — J209 Acute bronchitis, unspecified: Secondary | ICD-10-CM | POA: Diagnosis not present

## 2022-05-06 DIAGNOSIS — R062 Wheezing: Secondary | ICD-10-CM | POA: Diagnosis not present

## 2023-04-09 ENCOUNTER — Ambulatory Visit: Payer: BC Managed Care – PPO | Admitting: Family Medicine

## 2023-04-09 ENCOUNTER — Encounter: Payer: Self-pay | Admitting: Family Medicine

## 2023-04-09 VITALS — BP 126/72 | HR 53 | Ht 63.0 in | Wt 124.3 lb

## 2023-04-09 DIAGNOSIS — Z1339 Encounter for screening examination for other mental health and behavioral disorders: Secondary | ICD-10-CM | POA: Diagnosis not present

## 2023-04-09 DIAGNOSIS — Z01419 Encounter for gynecological examination (general) (routine) without abnormal findings: Secondary | ICD-10-CM | POA: Diagnosis not present

## 2023-04-09 DIAGNOSIS — Z1211 Encounter for screening for malignant neoplasm of colon: Secondary | ICD-10-CM | POA: Diagnosis not present

## 2023-04-09 DIAGNOSIS — Z1231 Encounter for screening mammogram for malignant neoplasm of breast: Secondary | ICD-10-CM | POA: Diagnosis not present

## 2023-04-09 NOTE — Progress Notes (Addendum)
ANNUAL EXAM Patient nameJanmarie Bowers MRN [id number]  Date of birth: [birth year] Chief Complaint:   No chief complaint on file.  History of Present Illness:   Marie Bowers is a 57 y.o. [id number]  Asian  female being seen today for a routine annual exam.  Current complaints: none, here to ensure up to date on routine   Patient's last menstrual period was 02/28/2013.   The pregnancy intention screening data noted above was reviewed. Potential methods of contraception were discussed. The patient elected to proceed with No data recorded.   Last pap 12/19/2020. Results were:  NILM, no HPV testing . H/O abnormal pap: no Last mammogram: 04/03/2022. Results were: normal. Family h/o breast cancer: no Last colonoscopy: 03/28/2007. Results were: abnormal single transverse tubular adenoma w/o high grade syplasia or malignancy identified . Family h/o colorectal cancer: no     04/09/2023    8:31 AM 03/27/2022    8:36 AM 11/30/2019    9:10 AM 02/28/2015   11:22 AM 02/21/2015   11:30 AM  Depression screen PHQ 2/9  Decreased Interest 0 0 0 0 0  Down, Depressed, Hopeless 0 0 0 0 0  PHQ - 2 Score 0 0 0 0 0  Altered sleeping 0 0 0    Tired, decreased energy 1 0 0    Change in appetite 0 0 0    Feeling bad or failure about yourself  0 0 0    Trouble concentrating 0 0 0    Moving slowly or fidgety/restless 0 0 0    Suicidal thoughts 0 0 0    PHQ-9 Score 1 0 0    Difficult doing work/chores   Not difficult at all          04/09/2023    8:31 AM 03/27/2022    8:36 AM  GAD 7 : Generalized Anxiety Score  Nervous, Anxious, on Edge 0 0  Control/stop worrying 0 0  Worry too much - different things 0 0  Trouble relaxing 0 0  Restless 0 0  Easily annoyed or irritable 0 0  Afraid - awful might happen 0 0  Total GAD 7 Score 0 0     Review of Systems:   Pertinent items are noted in HPI Denies any headaches, blurred vision, fatigue, shortness of breath, chest pain, abdominal pain, abnormal vaginal  discharge/itching/odor/irritation, problems with periods, bowel movements, urination, or intercourse unless otherwise stated above. Pertinent History Reviewed:  Reviewed past medical,surgical, social and family history.  Reviewed problem list, medications and allergies. Physical Assessment:   Vitals:   04/09/23 0813  BP: 126/72  Pulse: (!) 53  Weight: 124 lb 4.8 oz (56.4 kg)  Height: 5\' 3"  (1.6 m)  Body mass index is 22.02 kg/m.        Physical Examination:   General appearance - well appearing, and in no distress  Mental status - alert, oriented to person, place, and time  Psych:  She has a normal mood and affect  Skin - warm and dry, normal color, no suspicious lesions noted  Chest - effort normal, no respiratory distress   Heart - normal rate and regular rhythm  Neck:  midline trachea, no thyromegaly or nodules  Breasts - deferred  Abdomen - soft, nontender, nondistended, no masses or organomegaly  Pelvic - deferred  Thin prep pap is not done, due 2026 (co-testing negative 2021)  Extremities:  No swelling or varicosities noted  Chaperone present for exam  No results found for this  or any previous visit (from the past 24 hour(s)).  Assessment & Plan:  1) Well-Woman Exam  2)  #Encounter for screening mammogram for malignant neoplasm of breast - MM 3D SCREENING MAMMOGRAM BILATERAL BREAST; Future  # Colon cancer screening - Ambulatory referral to Gastroenterology  Labs/procedures today: none   Mammogram: schedule screening mammo as soon as possible, or sooner if problems Colonoscopy: schedule screening colonoscopy as soon as possible, or sooner if problems  Orders Placed This Encounter  Procedures   MM 3D SCREENING MAMMOGRAM BILATERAL BREAST   Ambulatory referral to Gastroenterology    Meds: No orders of the defined types were placed in this encounter.   Follow-up: Return in about 1 year (around 04/08/2024) for Annual .  Hessie Dibble, MD 04/09/2023 8:44 AM

## 2023-04-24 ENCOUNTER — Encounter: Payer: Self-pay | Admitting: Internal Medicine

## 2023-05-06 ENCOUNTER — Encounter (HOSPITAL_BASED_OUTPATIENT_CLINIC_OR_DEPARTMENT_OTHER): Payer: Self-pay | Admitting: Radiology

## 2023-05-06 ENCOUNTER — Ambulatory Visit (HOSPITAL_BASED_OUTPATIENT_CLINIC_OR_DEPARTMENT_OTHER)
Admission: RE | Admit: 2023-05-06 | Discharge: 2023-05-06 | Disposition: A | Discharge status: Home / Self Care | Payer: BC Managed Care – PPO | Source: Ambulatory Visit | Attending: Family Medicine | Admitting: Family Medicine

## 2023-05-06 DIAGNOSIS — Z01419 Encounter for gynecological examination (general) (routine) without abnormal findings: Secondary | ICD-10-CM | POA: Insufficient documentation

## 2023-05-06 DIAGNOSIS — Z1231 Encounter for screening mammogram for malignant neoplasm of breast: Secondary | ICD-10-CM | POA: Insufficient documentation

## 2023-05-07 ENCOUNTER — Other Ambulatory Visit: Payer: Self-pay | Admitting: Family Medicine

## 2023-05-23 ENCOUNTER — Ambulatory Visit (AMBULATORY_SURGERY_CENTER): Payer: BC Managed Care – PPO

## 2023-05-23 VITALS — Ht 63.0 in | Wt 125.4 lb

## 2023-05-23 DIAGNOSIS — Z1211 Encounter for screening for malignant neoplasm of colon: Secondary | ICD-10-CM

## 2023-05-23 DIAGNOSIS — Z860100 Personal history of colon polyps, unspecified: Secondary | ICD-10-CM

## 2023-05-23 MED ORDER — NA SULFATE-K SULFATE-MG SULF 17.5-3.13-1.6 GM/177ML PO SOLN: 1.0000 | Freq: Once | ORAL | 0 refills | Status: AC

## 2023-05-23 NOTE — Progress Notes (Signed)
No egg or soy allergy known to patient  No issues known to pt with past sedation with any surgeries or procedures Patient denies ever being told they had issues or difficulty with intubation  No FH of Malignant Hyperthermia Pt is not on diet pills Pt is not on  home 02  Pt is not on blood thinners  Pt denies issues with chronic constipation  No A fib or A flutter Have any cardiac testing pending--no LOA: independent  Prep: suprep   Patient's chart reviewed by Cathlyn Parsons CNRA prior to previsit and patient appropriate for the [facility].  Previsit completed and red dot placed by patient's name on their procedure day (on provider's schedule).     PV competed with patient. Interpreter (Lex) present at visit. Prep instructions sent via mychart and home address. Goodrx coupon for [employer] provided to use for price reduction if needed.

## 2023-06-13 ENCOUNTER — Encounter: Payer: Self-pay | Admitting: Internal Medicine

## 2023-06-13 NOTE — Progress Notes (Signed)
 [facility] History and Physical   Primary Care Physician:  Patient, No Pcp Per   Reason for Procedure:   Hx colon polyp  Plan:    colonoscopy     HPI: Aneliz Fullilove is a 58 y.o. female s/p removal of 1 adenoma during last colonoscopy 2008 ([facility]). Here for f/u exam.   Past Medical History:  Diagnosis Date   Foot fracture, left 09/01/2020    Past Surgical History:  Procedure Laterality Date   COLONOSCOPY     TUBAL LIGATION      Prior to Admission medications   Medication Sig Start Date End Date Taking? Authorizing Provider  multivitamin-iron-minerals-folic acid (CENTRUM) chewable tablet Chew 1 tablet by mouth daily. Patient not taking: Reported on 05/23/2023 03/27/22   Forlan, Nicole, NP  PARoxetine  (PAXIL ) 10 MG tablet Take 1 tablet (10 mg total) by mouth daily. Patient not taking: Reported on 11/30/2019 02/28/15   Copland, Harlene BROCKS, MD    Current Outpatient Medications  Medication Sig Dispense Refill   multivitamin-iron-minerals-folic acid (CENTRUM) chewable tablet Chew 1 tablet by mouth daily. (Patient not taking: Reported on 05/23/2023) 60 tablet 6   PARoxetine  (PAXIL ) 10 MG tablet Take 1 tablet (10 mg total) by mouth daily. (Patient not taking: Reported on 11/30/2019) 30 tablet 5   No current facility-administered medications for this visit.    Allergies as of 06/14/2023   (No Known Allergies)    Family History  Adopted: Yes  Problem Relation Age of Onset   Colon cancer Neg Hx    Colon polyps Neg Hx    Esophageal cancer Neg Hx    Rectal cancer Neg Hx    Stomach cancer Neg Hx     Social History   Socioeconomic History   Marital status: Married    Spouse name: Not on file   Number of children: Not on file   Years of education: Not on file   Highest education level: Not on file  Occupational History   Not on file  Tobacco Use   Smoking status: Never   Smokeless tobacco: Never  Vaping Use   Vaping status: Never Used  Substance and Sexual  Activity   Alcohol use: No   Drug use: No   Sexual activity: Not Currently    Partners: Male    Birth control/protection: Surgical  Other Topics Concern   Not on file  Social History Narrative   Not on file   Social Drivers of Health   Financial Resource Strain: Not on file  Food Insecurity: Not on file  Transportation Needs: Not on file  Physical Activity: Not on file  Stress: Not on file  Social Connections: Not on file  Intimate Partner Violence: Not on file    Review of Systems:  All other review of systems negative except as mentioned in the HPI.  Physical Exam: Vital signs LMP 02/28/2013   General:   Alert,  Well-developed, well-nourished, pleasant and cooperative in NAD Lungs:  Clear throughout to auscultation.   Heart:  Regular rate and rhythm; no murmurs, clicks, rubs,  or gallops. Abdomen:  Soft, nontender and nondistended. Normal bowel sounds.   Neuro/Psych:  Alert and cooperative. Normal mood and affect. A and O x 3   @Erhardt Dada  CHARLENA Commander, MD, [facility] [phone] (pager) 06/13/2023 6:54 PM@

## 2023-06-14 ENCOUNTER — Encounter: Payer: Self-pay | Admitting: Internal Medicine

## 2023-06-14 ENCOUNTER — Ambulatory Visit (AMBULATORY_SURGERY_CENTER): Payer: BC Managed Care – PPO | Admitting: Internal Medicine

## 2023-06-14 VITALS — BP 108/60 | HR 68 | Temp 97.2°F | Resp 21 | Ht 63.0 in | Wt 120.4 lb

## 2023-06-14 DIAGNOSIS — Z860101 Personal history of adenomatous and serrated colon polyps: Secondary | ICD-10-CM | POA: Diagnosis not present

## 2023-06-14 DIAGNOSIS — D122 Benign neoplasm of ascending colon: Secondary | ICD-10-CM | POA: Diagnosis not present

## 2023-06-14 DIAGNOSIS — K644 Residual hemorrhoidal skin tags: Secondary | ICD-10-CM

## 2023-06-14 DIAGNOSIS — Z1211 Encounter for screening for malignant neoplasm of colon: Secondary | ICD-10-CM | POA: Diagnosis not present

## 2023-06-14 DIAGNOSIS — K648 Other hemorrhoids: Secondary | ICD-10-CM | POA: Diagnosis not present

## 2023-06-14 DIAGNOSIS — D123 Benign neoplasm of transverse colon: Secondary | ICD-10-CM | POA: Diagnosis not present

## 2023-06-14 DIAGNOSIS — Z860100 Personal history of colon polyps, unspecified: Secondary | ICD-10-CM

## 2023-06-14 MED ORDER — SODIUM CHLORIDE 0.9 % IV SOLN: 500.0000 mL | Freq: Once | INTRAVENOUS | Status: DC

## 2023-06-14 NOTE — Progress Notes (Signed)
 Called to room to assist during endoscopic procedure.  Patient ID and intended procedure confirmed with present staff. Received instructions for my participation in the procedure from the performing physician.

## 2023-06-14 NOTE — Progress Notes (Signed)
 Pt's states no medical or surgical changes since previsit or office visit.

## 2023-06-14 NOTE — Progress Notes (Signed)
 Sedate, gd SR, tolerated procedure well, VSS, report to RN

## 2023-06-14 NOTE — Op Note (Signed)
 [facility] Patient Name: Marie Bowers Procedure Date: 06/14/2023 9:56 AM MRN: [id number] Endoscopist: Lupita FORBES Commander , MD, [id number] Age: 58 Referring MD:  Date of Birth: [birth year] Gender: Female Account #: [id number] Procedure:                Colonoscopy Indications:              Surveillance: Personal history of adenomatous                            polyps on last colonoscopy > 5 years ago, Last                            colonoscopy: 2008 Medicines:                Monitored Anesthesia Care Procedure:                Pre-Anesthesia Assessment:                           - Prior to the procedure, a History and Physical                            was performed, and patient medications and                            allergies were reviewed. The patient's tolerance of                            previous anesthesia was also reviewed. The risks                            and benefits of the procedure and the sedation                            options and risks were discussed with the patient.                            All questions were answered, and informed consent                            was obtained. Prior Anticoagulants: The patient has                            taken no anticoagulant or antiplatelet agents. ASA                            Grade Assessment: II - A patient with mild systemic                            disease. After reviewing the risks and benefits,                            the patient was deemed in satisfactory condition to  undergo the procedure.                           After obtaining informed consent, the colonoscope                            was passed under direct vision. Throughout the                            procedure, the patient's blood pressure, pulse, and                            oxygen saturations were monitored continuously. The                            Olympus Scope [id number] was introduced through the                             anus and advanced to the the cecum, identified by                            appendiceal orifice and ileocecal valve. The                            colonoscopy was performed without difficulty. The                            patient tolerated the procedure well. The quality                            of the bowel preparation was good. The ileocecal                            valve, appendiceal orifice, and rectum were                            photographed. The bowel preparation used was SUPREP                            via split dose instruction. Scope In: 10:16:52 AM Scope Out: 10:31:36 AM Scope Withdrawal Time: 0 hours 12 minutes 19 seconds  Total Procedure Duration: 0 hours 14 minutes 44 seconds  Findings:                 The perianal and digital rectal examinations were                            normal.                           Two sessile polyps were found in the transverse                            colon and ascending colon. The polyps were  diminutive in size. These polyps were removed with                            a cold snare. Resection and retrieval were                            complete. Verification of patient identification                            for the specimen was done. Estimated blood loss was                            minimal.                           External and internal hemorrhoids were found.                           The exam was otherwise without abnormality on                            direct and retroflexion views. Complications:            No immediate complications. Estimated Blood Loss:     Estimated blood loss was minimal. Impression:               - Two diminutive polyps in the transverse colon and                            in the ascending colon, removed with a cold snare.                            Resected and retrieved.                           - External and internal hemorrhoids.                            - The examination was otherwise normal on direct                            and retroflexion views.                           - Personal history of colonic polyp small adenoma                            removed 2008. Recommendation:           - Patient has a contact number available for                            emergencies. The signs and symptoms of potential                            delayed complications were discussed with the  patient. Return to normal activities tomorrow.                            Written discharge instructions were provided to the                            patient.                           - Resume previous diet.                           - Continue present medications.                           - Repeat colonoscopy is recommended for                            surveillance. The colonoscopy date will be                            determined after pathology results from today's                            exam become available for review. Lupita FORBES Commander, MD 06/14/2023 10:40:12 AM This report has been signed electronically.

## 2023-06-14 NOTE — Progress Notes (Signed)
 Interpreter used today at the [facility] for this pt.  Interpreter's name is- Y'KEO EBAN   Pt's states no medical or surgical changes since previsit or office visit.

## 2023-06-14 NOTE — Patient Instructions (Addendum)
 Two small polyps removed. I will let you know pathology results and when to have another routine colonoscopy by mail and/or My Chart.  Also have hemorrhoids - common and not usually a problem.  I appreciate the opportunity to care for you. Lupita CHARLENA Commander, MD, FACG  YOU HAD AN ENDOSCOPIC PROCEDURE TODAY AT THE [facility]:   Refer to the procedure report that was given to you for any specific questions about what was found during the examination.  If the procedure report does not answer your questions, please call your gastroenterologist to clarify.  If you requested that your care partner not be given the details of your procedure findings, then the procedure report has been included in a sealed envelope for you to review at your convenience later.  YOU SHOULD EXPECT: Some feelings of bloating in the abdomen. Passage of more gas than usual.  Walking can help get rid of the air that was put into your GI tract during the procedure and reduce the bloating. If you had a lower endoscopy (such as a colonoscopy or flexible sigmoidoscopy) you may notice spotting of blood in your stool or on the toilet paper. If you underwent a bowel prep for your procedure, you may not have a normal bowel movement for a few days.  Please Note:  You might notice some irritation and congestion in your nose or some drainage.  This is from the oxygen used during your procedure.  There is no need for concern and it should clear up in a day or so.  SYMPTOMS TO REPORT IMMEDIATELY:  Following lower endoscopy (colonoscopy or flexible sigmoidoscopy):  Excessive amounts of blood in the stool  Significant tenderness or worsening of abdominal pains  Swelling of the abdomen that is new, acute  Fever of 100F or higher  For urgent or emergent issues, a gastroenterologist can be reached at any hour by calling [phone]. Do not use MyChart messaging for urgent concerns.    DIET:  We do recommend a small meal at  first, but then you may proceed to your regular diet.  Drink plenty of fluids but you should avoid alcoholic beverages for 24 hours.  ACTIVITY:  You should plan to take it easy for the rest of today and you should NOT DRIVE or use heavy machinery until tomorrow (because of the sedation medicines used during the test).    FOLLOW UP: Our staff will call the number listed on your records the next business day following your procedure.  We will call around 7:15- 8:00 am to check on you and address any questions or concerns that you may have regarding the information given to you following your procedure. If we do not reach you, we will leave a message.     If any biopsies were taken you will be contacted by phone or by letter within the next 1-3 weeks.  Please call us  at [phone] if you have not heard about the biopsies in 3 weeks.    SIGNATURES/CONFIDENTIALITY: You and/or your care partner have signed paperwork which will be entered into your electronic medical record.  These signatures attest to the fact that that the information above on your After Visit Summary has been reviewed and is understood.  Full responsibility of the confidentiality of this discharge information lies with you and/or your care-partner.

## 2023-06-17 ENCOUNTER — Telehealth: Payer: Self-pay | Admitting: *Deleted

## 2023-06-17 NOTE — Telephone Encounter (Signed)
 Attempted f/u phone call. No answer. Left message.

## 2023-06-18 LAB — SURGICAL PATHOLOGY

## 2023-06-19 ENCOUNTER — Encounter: Payer: Self-pay | Admitting: Internal Medicine

## 2023-06-30 IMAGING — MG MM DIGITAL SCREENING BILAT W/ TOMO AND CAD
8 series · 8 of 24 positions shown · non-contrast
Comparison: Previous exam(s).

CLINICAL DATA: Screening.

EXAM:
DIGITAL SCREENING BILATERAL MAMMOGRAM WITH TOMOSYNTHESIS AND CAD
TECHNIQUE: Bilateral screening digital craniocaudal and mediolateral oblique
mammograms were obtained. Bilateral screening digital breast
tomosynthesis was performed. The images were evaluated with
computer-aided detection.

[L MLO synth-2D]
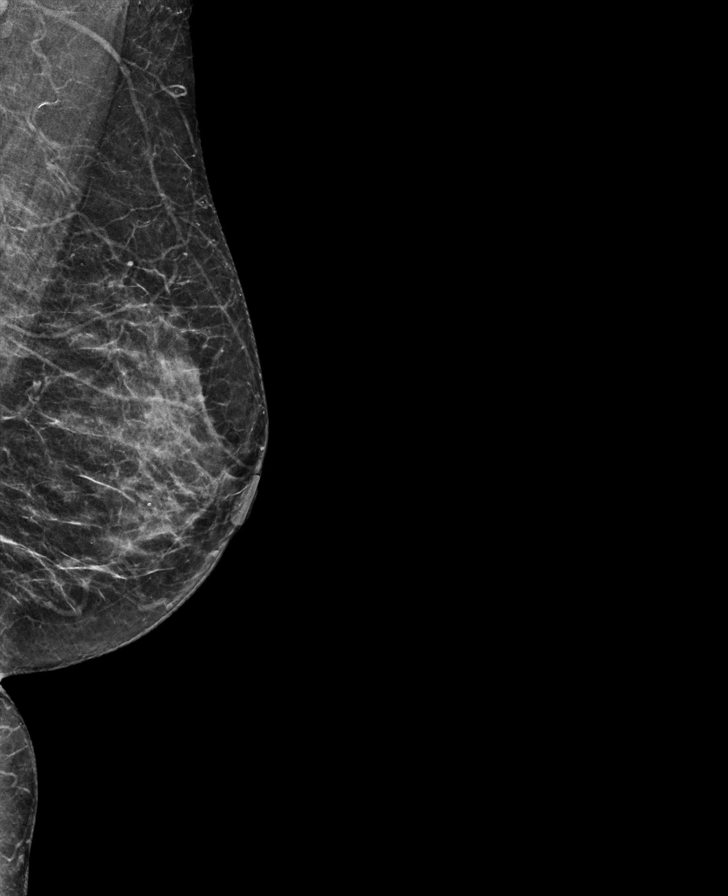

[L CC synth-2D]
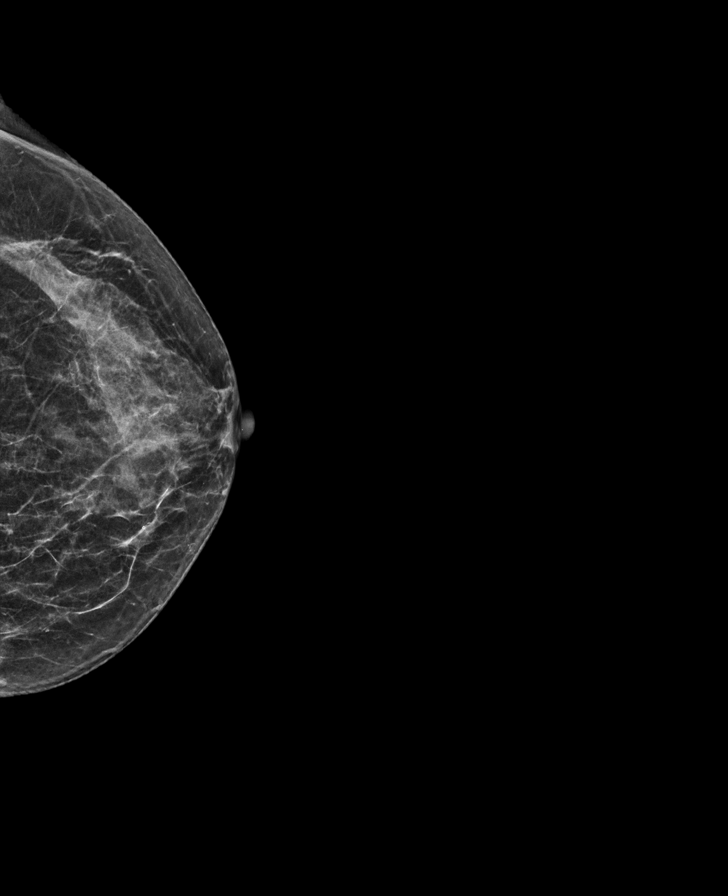

[R MLO synth-2D]
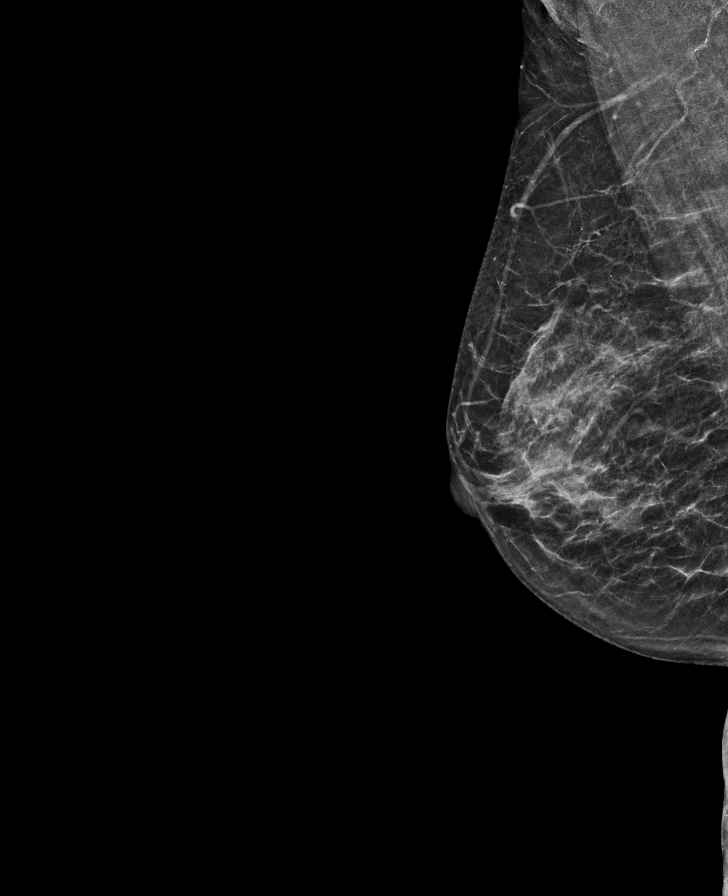

[R CC synth-2D]
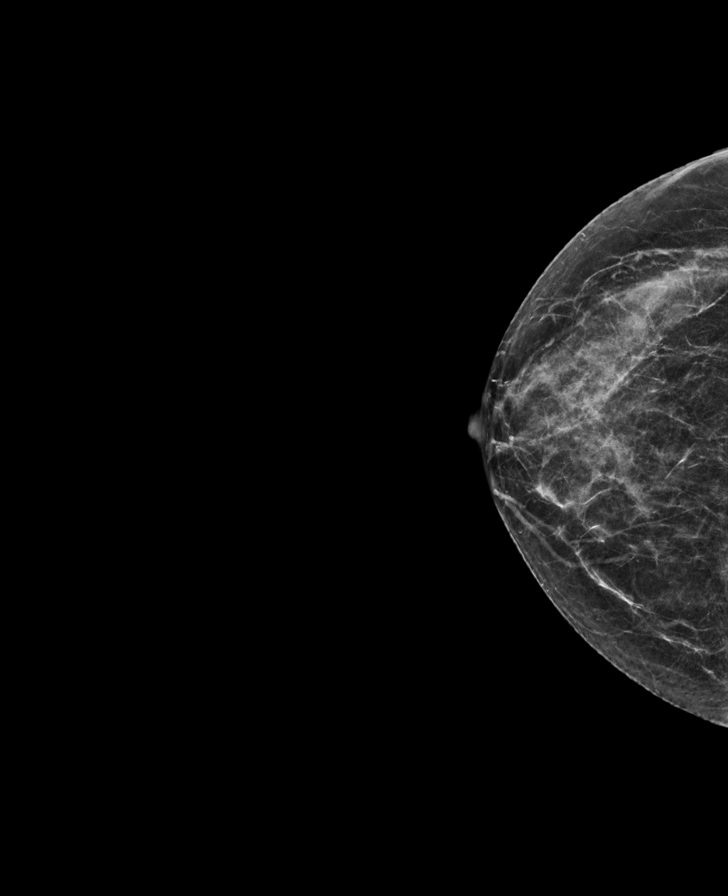

[L MLO tomo · tomo slice 26/51.0]
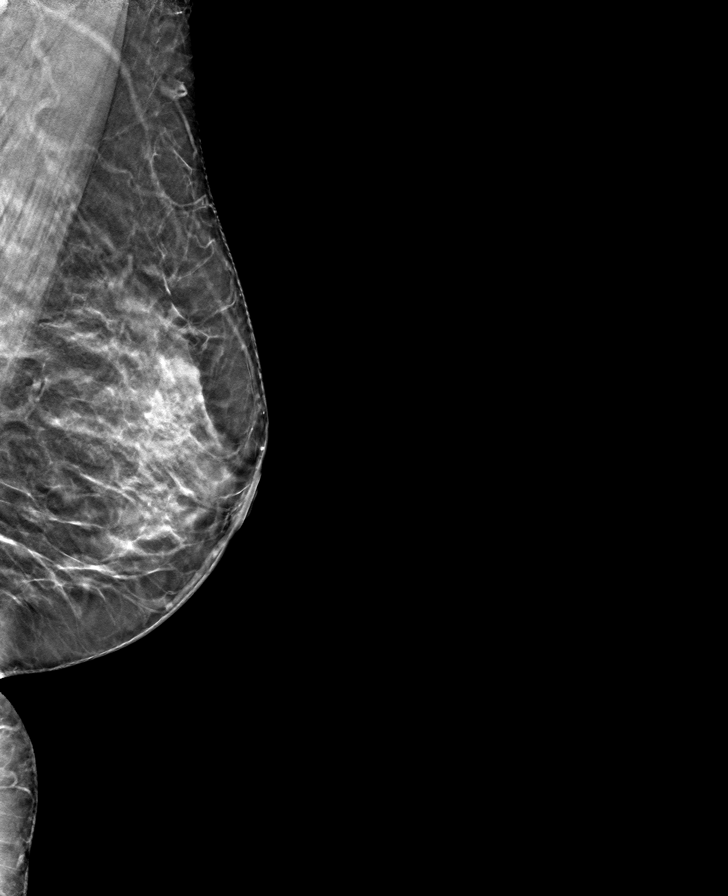

[R CC tomo · tomo slice 26/51.0]
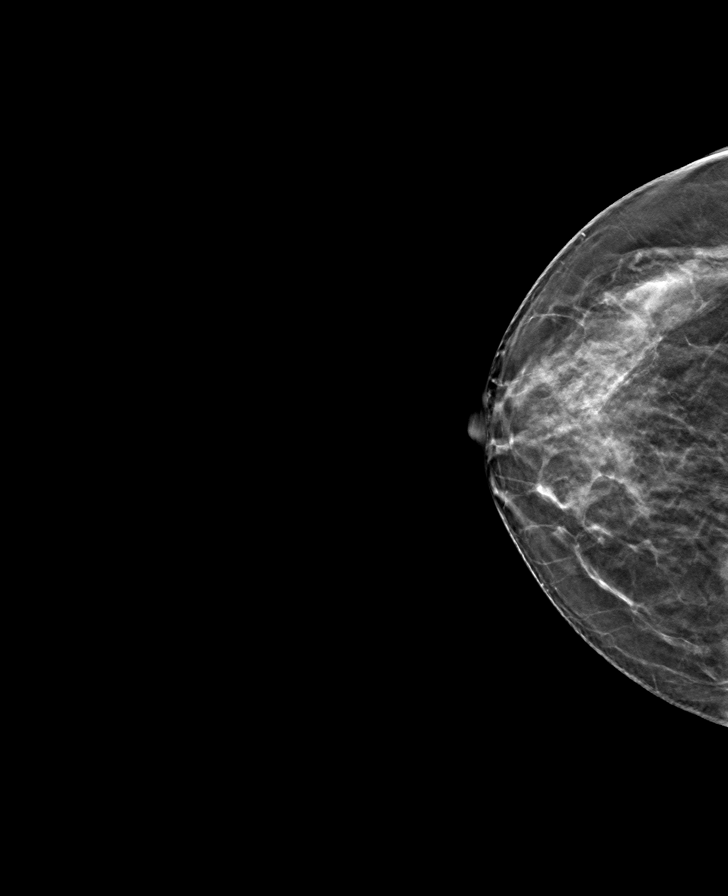

[L CC tomo · tomo slice 25/49.0]
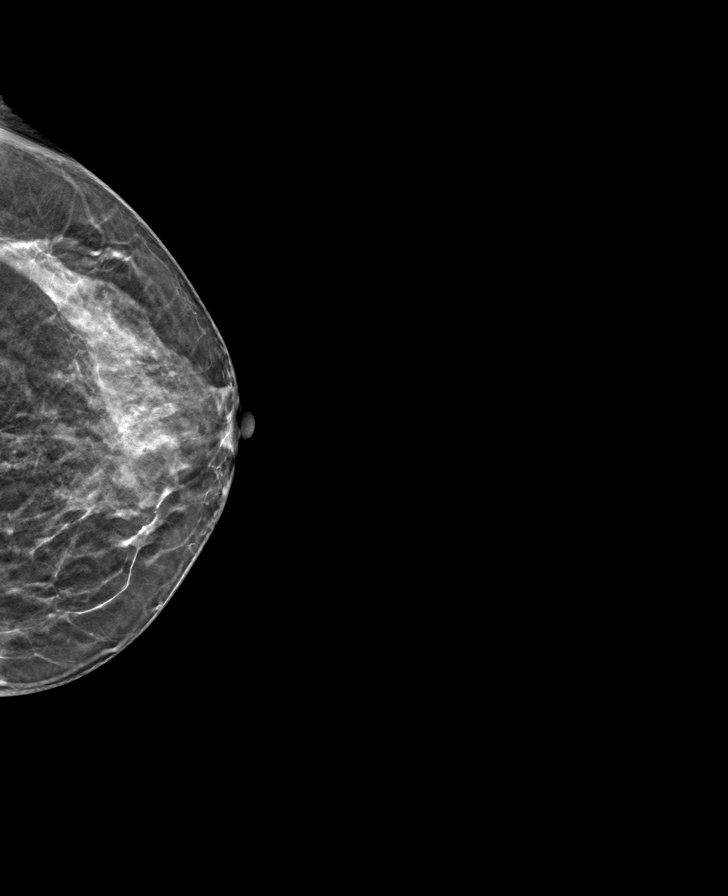

[R MLO tomo · tomo slice 26/51.0]
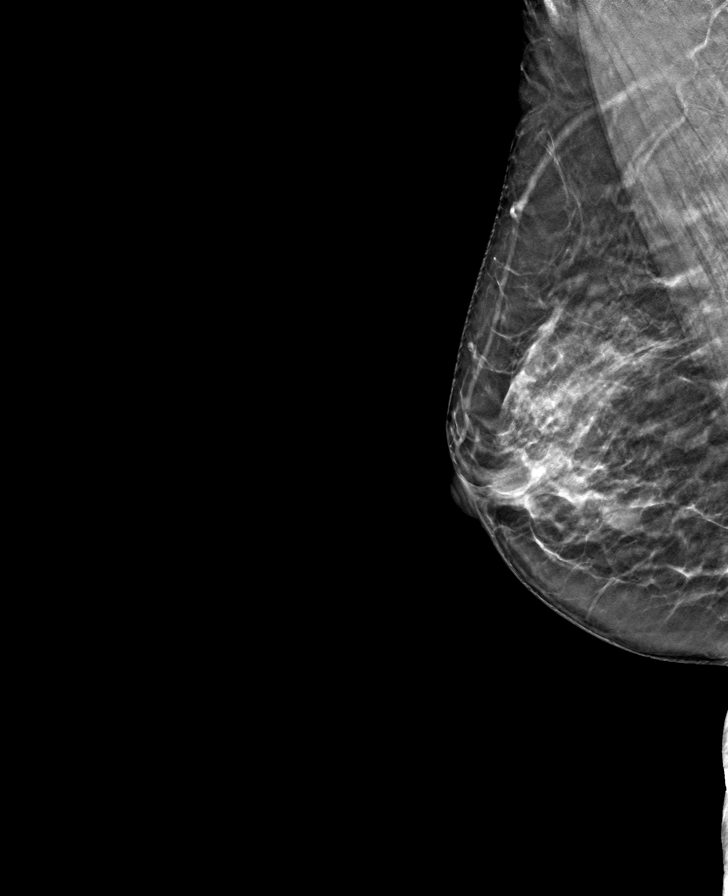

[8 of 24 positions shown; findings below may reference images not displayed]

ACR Breast Density Category c: The breast tissue is heterogeneously
dense, which may obscure small masses.
FINDINGS: There are no findings suspicious for malignancy.
IMPRESSION: No mammographic evidence of malignancy. A result letter of this
screening mammogram will be mailed directly to the patient.

RECOMMENDATION:
Screening mammogram in one year. (Code:Q3-W-BC3)

BI-RADS CATEGORY  1: Negative.
